# Patient Record
Sex: Female | Born: 1980 | Hispanic: Yes | Marital: Married | Smoking: Never smoker
Health system: Southern US, Community
[De-identification: ages and names within clinical notes are randomized; demographics above are authoritative.]

## PROBLEM LIST (undated history)

## (undated) ENCOUNTER — Inpatient Hospital Stay: Payer: Self-pay

## (undated) DIAGNOSIS — Z789 Other specified health status: Secondary | ICD-10-CM

## (undated) HISTORY — PX: NO PAST SURGERIES: SHX2092

---

## 2008-12-18 ENCOUNTER — Emergency Department: Payer: Self-pay | Admitting: Emergency Medicine

## 2009-03-03 ENCOUNTER — Ambulatory Visit: Payer: Self-pay | Admitting: Family Medicine

## 2009-03-03 IMAGING — US US OB US >=[ID] SNGL FETUS
1 series · 17 of 28 positions shown · non-contrast
Comparison: none

REASON FOR EXAM: dates
COMMENTS:

[Series 1: us ob us >=(id) sngl fetus · 17 of 53 slices shown]
[im 1/53]
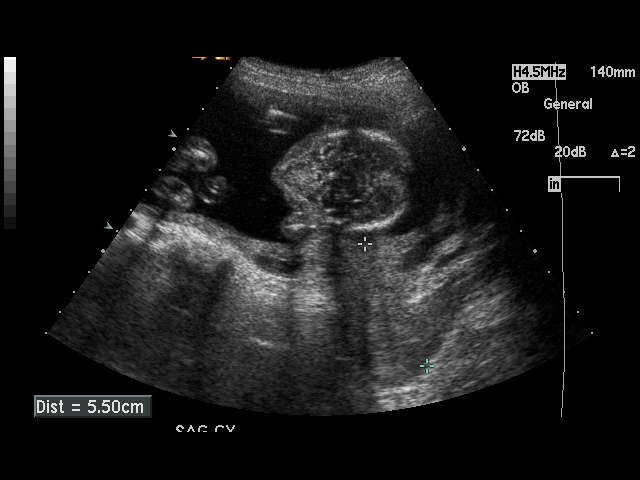
[im 4/53]
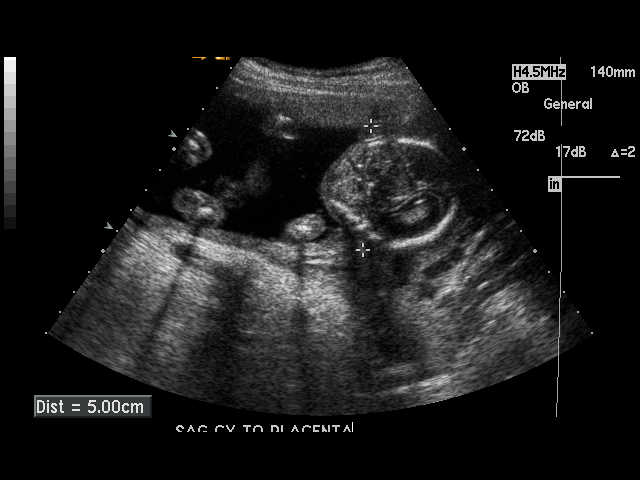
[im 8/53]
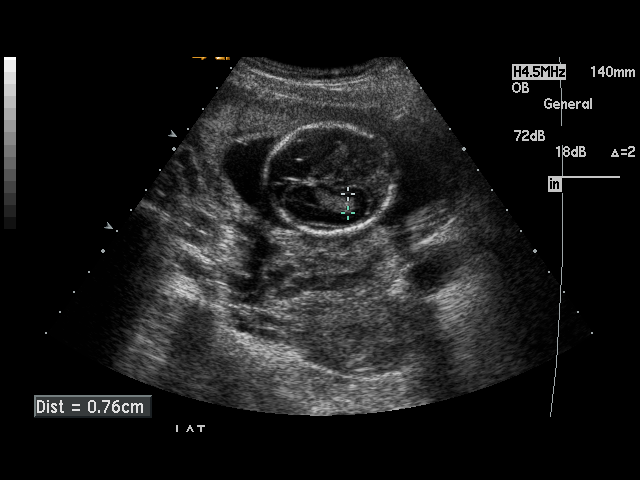
[im 10/53]
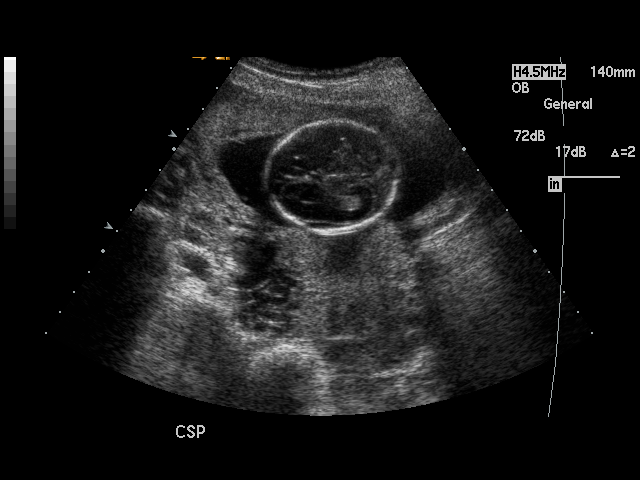
[im 14/53]
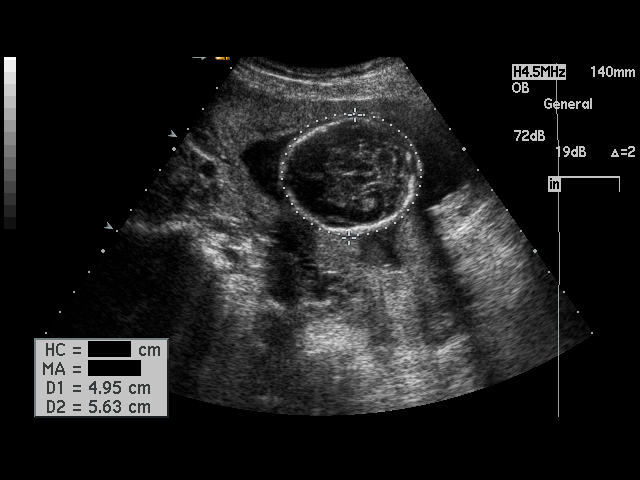
[im 18/53]
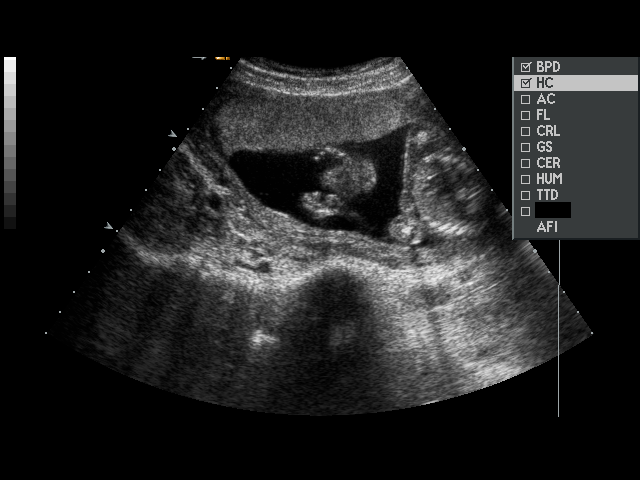
[im 20/53]
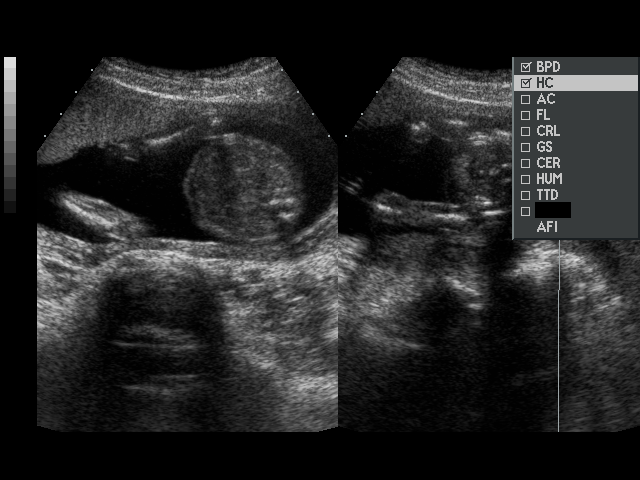
[im 24/53]
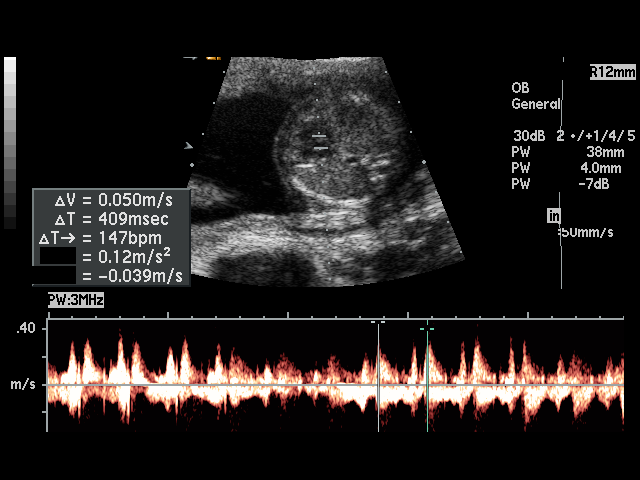
[im 27/53]
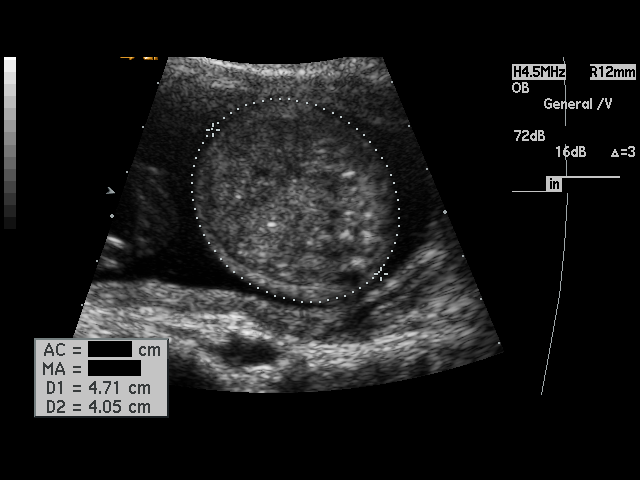
[im 29/53]
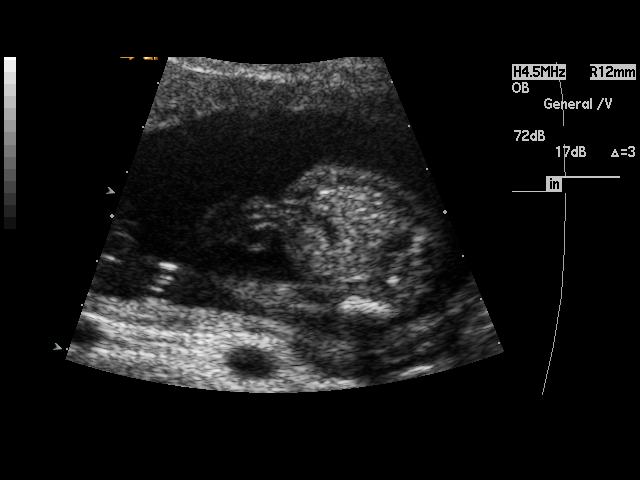
[im 33/53]
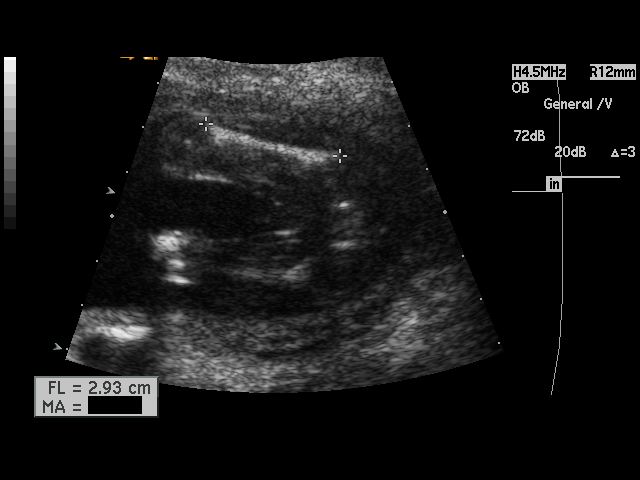
[im 35/53]
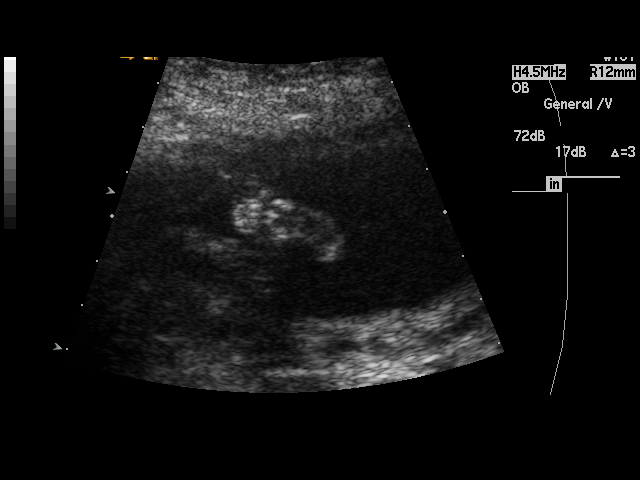
[im 39/53]
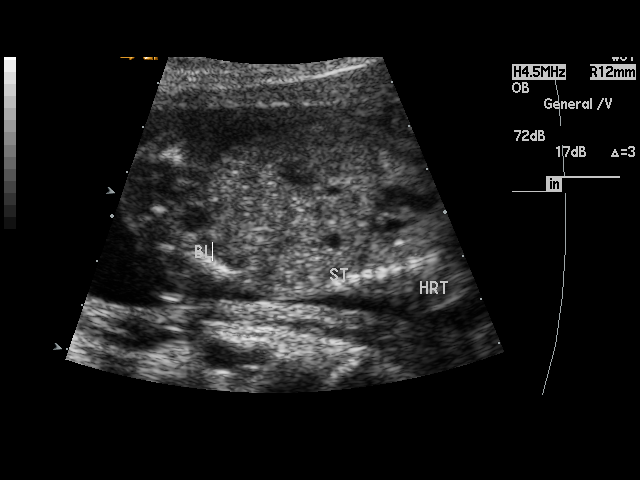
[im 43/53]
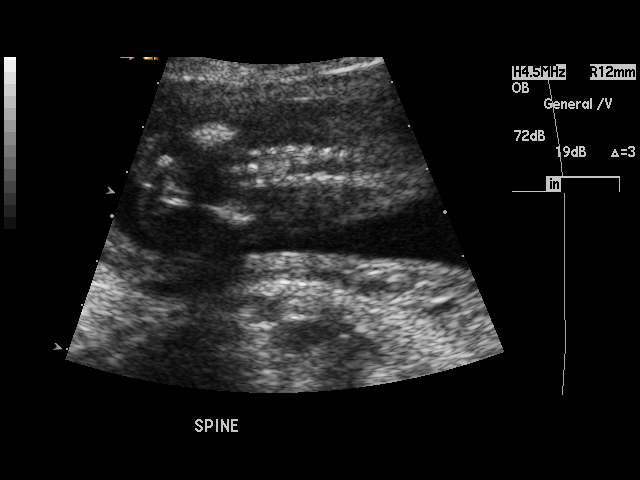
[im 45/53]
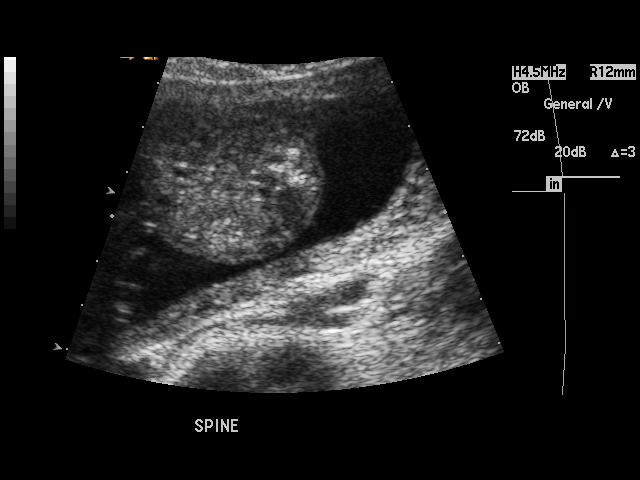
[im 49/53]
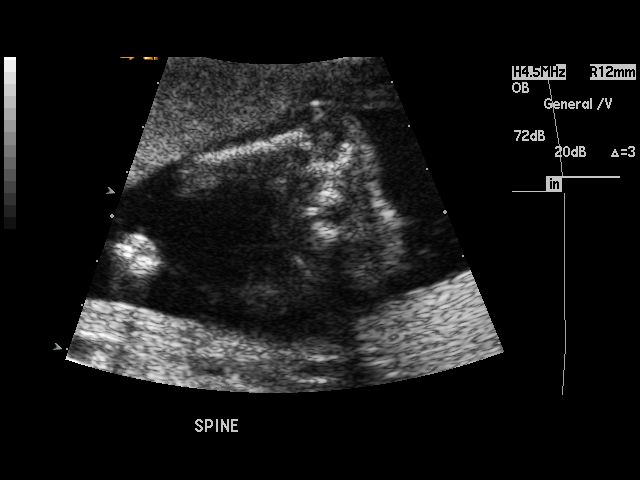
[im 53/53]
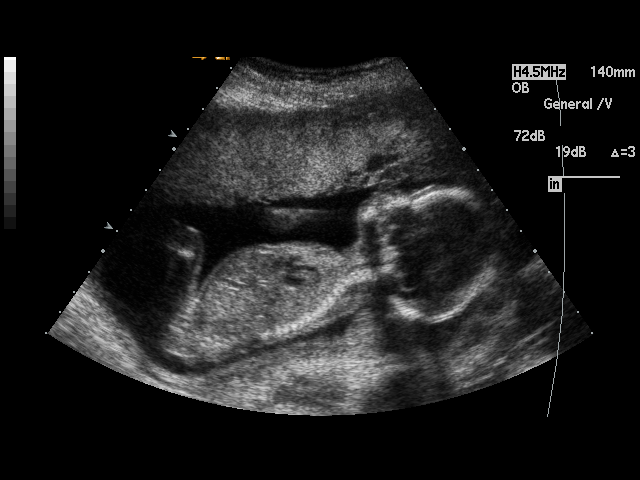

[17 of 28 positions shown; findings below may reference images not displayed]

PROCEDURE:     US  - US OB GREATER/OR EQUAL TO [J8]  - [DATE]  [DATE]

RESULT:     Sonographic evaluation of the pelvis utilizing an OB protocol
shows a single intrauterine fetus. The placenta is anterior to fundal and
shows a grade 0 appearance. Fetal cardiac, trunk and extremity motion is
present during the exam. There is a cephalic presentation. Fetal heart rate
is measured at 147 beats per minute. The fetal anatomy appears grossly
normal. The kidneys are not well seen. The amniotic fluid volume is visually
normal. The umbilical cord insertion is within normal limits. Gestational
age sonographically is 19 [J8] days with an estimated delivery date of
[DATE]. Current estimated fetal weight is 287 grams + / - 38 grams.
IMPRESSION: 19 [J8] day intrauterine fetus as described. No
significant abnormality demonstrated.

## 2009-08-01 ENCOUNTER — Inpatient Hospital Stay: Payer: Self-pay | Admitting: Obstetrics and Gynecology

## 2012-07-21 ENCOUNTER — Observation Stay: Payer: Self-pay | Admitting: Obstetrics and Gynecology

## 2012-07-24 ENCOUNTER — Inpatient Hospital Stay: Payer: Self-pay

## 2012-07-24 LAB — CBC WITH DIFFERENTIAL/PLATELET
Basophil %: 0.4 %
Eosinophil %: 3.8 %
HCT: 40.9 % (ref 35.0–47.0)
HGB: 13.7 g/dL (ref 12.0–16.0)
Lymphocyte #: 2.6 10*3/uL (ref 1.0–3.6)
MCV: 81 fL (ref 80–100)
Neutrophil #: 9.3 10*3/uL — ABNORMAL HIGH (ref 1.4–6.5)
Neutrophil %: 70.2 %
Platelet: 217 10*3/uL (ref 150–440)
RBC: 5.08 10*6/uL (ref 3.80–5.20)
WBC: 13.3 10*3/uL — ABNORMAL HIGH (ref 3.6–11.0)

## 2012-07-25 LAB — HEMATOCRIT: HCT: 28.7 % — ABNORMAL LOW (ref 35.0–47.0)

## 2014-08-06 NOTE — H&P (Signed)
L&D Evaluation:  History:  HPI 34 y/o G2P1001 @ 39/6wks [facility] 07/25/12 arrives this am with c/o leaking fluid, regular contractions and smallbloody show, baby is active.   Presents with contractions, leaking fluid   Patient's Medical History No Chronic Illness   Patient's Surgical History none   Medications Pre Natal Vitamins   Allergies NKDA   Social History none   Family History Non-Contributory   ROS:  ROS All systems were reviewed.  HEENT, CNS, GI, GU, Respiratory, CV, Renal and Musculoskeletal systems were found to be normal.   Exam:  Vital Signs stable   Urine Protein not completed   General no apparent distress   Mental Status clear   Chest clear   Heart normal sinus rhythm   Abdomen gravid, non-tender   Estimated Fetal Weight Average for gestational age   Fetal Position vtx   Fundal Height term   Back no CVAT   Edema no edema   Reflexes 1+   Clonus negative   Pelvic no external lesions, 5cm 60% vtx @ -2 Clear fluid nl show   Mebranes Ruptured, 0600   Description clear   FHT normal rate with no decels, baseline 130's 140's avg variability with accels   FHT Description 144   Ucx regular, EFM reapplied after ambulating   Skin dry   Lymph no lymphadenopathy   Impression:  Impression active labor   Plan:  Plan monitor contractions and for cervical change   Comments Admitted, knows what to expect. VSS AF FHR reactive. Breathing through uc's well, requests epidural. FOB supportive at bedside.   Electronic Signatures: Rosie Fate (CNM)  (Signed 28-Apr-14 10:31)  Authored: L&D Evaluation   Last Updated: 28-Apr-14 10:31 by Rosie Fate (CNM)

## 2014-12-18 LAB — HM PAP SMEAR: HM Pap smear: NEGATIVE

## 2017-03-01 ENCOUNTER — Other Ambulatory Visit: Payer: Self-pay | Admitting: Advanced Practice Midwife

## 2017-03-01 DIAGNOSIS — Z369 Encounter for antenatal screening, unspecified: Secondary | ICD-10-CM

## 2017-03-01 DIAGNOSIS — Z550 Illiteracy and low-level literacy: Secondary | ICD-10-CM | POA: Insufficient documentation

## 2017-03-02 LAB — OB RESULTS CONSOLE RPR: RPR: NONREACTIVE

## 2017-03-02 LAB — OB RESULTS CONSOLE HEPATITIS B SURFACE ANTIGEN: HEP B S AG: NEGATIVE

## 2017-03-17 ENCOUNTER — Ambulatory Visit (HOSPITAL_BASED_OUTPATIENT_CLINIC_OR_DEPARTMENT_OTHER)
Admission: RE | Admit: 2017-03-17 | Discharge: 2017-03-17 | Disposition: A | Discharge status: Home / Self Care | Payer: Medicaid Other | Source: Ambulatory Visit | Attending: Obstetrics and Gynecology | Admitting: Obstetrics and Gynecology

## 2017-03-17 ENCOUNTER — Ambulatory Visit
Admission: RE | Admit: 2017-03-17 | Discharge: 2017-03-17 | Disposition: A | Discharge status: Home / Self Care | Payer: Medicaid Other | Source: Ambulatory Visit | Attending: Advanced Practice Midwife | Admitting: Advanced Practice Midwife

## 2017-03-17 DIAGNOSIS — O09521 Supervision of elderly multigravida, first trimester: Secondary | ICD-10-CM | POA: Diagnosis present

## 2017-03-17 DIAGNOSIS — Z3A11 11 weeks gestation of pregnancy: Secondary | ICD-10-CM | POA: Insufficient documentation

## 2017-03-17 DIAGNOSIS — Z369 Encounter for antenatal screening, unspecified: Secondary | ICD-10-CM

## 2017-03-17 DIAGNOSIS — O09529 Supervision of elderly multigravida, unspecified trimester: Secondary | ICD-10-CM

## 2017-03-17 HISTORY — DX: Other specified health status: Z78.9

## 2017-03-17 NOTE — Progress Notes (Addendum)
Referring Provider:   [facility] Department Length of Consultation: 45 minutes  [name] was referred to Alyssa Marquez for genetic counseling because of advanced maternal age.  The patient will be 36 years old at the time of delivery.  This note summarizes the information we discussed.    We explained that the chance of a chromosome abnormality increases with maternal age.  Chromosomes and examples of chromosome problems were reviewed.  Humans typically have 46 chromosomes in each cell, with half passed through each sperm and egg.  Any change in the number or structure of chromosomes can increase the risk of problems in the physical and mental development of a pregnancy.   Based upon age of the patient, the chance of any chromosome abnormality was 1 in 35. The chance of Down syndrome, the most common chromosome problem associated with maternal age, was 1 in 13.  The risk of chromosome problems is in addition to the 3% general population risk for birth defects and mental retardation.  The greatest chance, of course, is that the baby would be born in good health.  We discussed the following prenatal screening and testing options for this pregnancy:  First trimester screening, which includes nuchal translucency ultrasound screen and first trimester maternal serum marker screening.  The nuchal translucency has approximately an 80% detection rate for Down syndrome and can be positive for other chromosome abnormalities as well as heart defects.  When combined with a maternal serum marker screening, the detection rate is up to 90% for Down syndrome and up to 97% for trisomy 18.     The chorionic villus sampling procedure is available for first trimester chromosome analysis.  This involves the withdrawal of a small amount of chorionic villi (tissue from the developing placenta).  Risk of pregnancy loss is estimated to be approximately 1 in 200 to 1 in 100 (0.5  to 1%).  There is approximately a 1% (1 in 100) chance that the CVS chromosome results will be unclear.  Chorionic villi cannot be tested for neural tube defects.     Maternal serum marker screening, a blood test that measures pregnancy proteins, can provide risk assessments for Down syndrome, trisomy 18, and open neural tube defects (spina bifida, anencephaly). Because it does not directly examine the fetus, it cannot positively diagnose or rule out these problems.  Targeted ultrasound uses high frequency sound waves to create an image of the developing fetus.  An ultrasound is often recommended as a routine means of evaluating the pregnancy.  It is also used to screen for fetal anatomy problems (for example, a heart defect) that might be suggestive of a chromosomal or other abnormality.   Amniocentesis involves the removal of a small amount of amniotic fluid from the sac surrounding the fetus with the use of a thin needle inserted through the maternal abdomen and uterus.  Ultrasound guidance is used throughout the procedure.  Fetal cells from amniotic fluid are directly evaluated and > 99.5% of chromosome problems and > 98% of open neural tube defects can be detected. This procedure is generally performed after the 15th week of pregnancy.  The main risks to this procedure include complications leading to miscarriage in less than 1 in 200 cases (0.5%).  We also reviewed the availability of cell free fetal DNA testing from maternal blood to determine whether or not the baby may have either Down syndrome, trisomy 13, or trisomy 12.  This test utilizes a maternal blood  sample and DNA sequencing technology to isolate circulating cell free fetal DNA from maternal plasma.  The fetal DNA can then be analyzed for DNA sequences that are derived from the three most common chromosomes involved in aneuploidy, chromosomes 13, 18, and 21.  If the overall amount of DNA is greater than the expected level for any of these  chromosomes, aneuploidy is suspected.  While we do not consider it a replacement for invasive testing and karyotype analysis, a negative result from this testing would be reassuring, though not a guarantee of a normal chromosome complement for the baby.  An abnormal result is certainly suggestive of an abnormal chromosome complement, though we would still recommend CVS or amniocentesis to confirm any findings from this testing.  Cystic Fibrosis and Spinal Muscular Atrophy (SMA) screening were also discussed with the patient. Both conditions are recessive, which means that both parents must be carriers in order to have a child with the disease.  Cystic fibrosis (CF) is one of the most common genetic conditions in persons of Caucasian ancestry.  This condition occurs in approximately 1 in 2,500 Caucasian persons and results in thickened secretions in the lungs, digestive, and reproductive systems.  For a baby to be at risk for having CF, both of the parents must be carriers for this condition.  Approximately 1 in 62 Caucasian persons is a carrier for CF.  Current carrier testing looks for the most common mutations in the gene for CF and can detect approximately 90% of carriers in the Caucasian population.  This means that the carrier screening can greatly reduce, but cannot eliminate, the chance for an individual to have a child with CF.  If an individual is found to be a carrier for CF, then carrier testing would be available for the partner. As part of [place] newborn screening profile, all babies born in the state of [place] will have a two-tier screening process.  Specimens are first tested to determine the concentration of immunoreactive trypsinogen (IRT).  The top 5% of specimens with the highest IRT values then undergo DNA testing using a panel of over 40 common CF mutations. SMA is a neurodegenerative disorder that leads to atrophy of skeletal muscle and overall weakness.  This condition is  also more prevalent in the Caucasian population, with 1 in 40-1 in 60 persons being a carrier and 1 in 6,000-1 in 10,000 children being affected.  There are multiple forms of the disease, with some causing death in infancy to other forms with survival into adulthood.  The genetics of SMA is complex, but carrier screening can detect up to 95% of carriers in the Caucasian population.  Similar to CF, a negative result can greatly reduce, but cannot eliminate, the chance to have a child with SMA.  We obtained a detailed family history and pregnancy history.  The family history is unremarkable for birth defects, developmental delays, recurrent pregnancy loss or known chromosome abnormalities.  [name] stated that this is her third pregnancy.  The couple has two healthy sons, ages 62 and 52 years.  She reported no complications or exposures that would be expected to increase the risk for birth defects.  After consideration of the options, [name] elected to proceed with an ultrasound and MaterniT21 testing. She declined CF and SMA carrier screening.  An ultrasound was performed at the time of the visit.  The gestational age was consistent with  41 weeks.  Fetal anatomy could not be assessed due  to early gestational age.  Please refer to the ultrasound report for details of that study. She is scheduled to return for a second trimester anatomy ultrasound (prior to the time her Pres[employer] Medicaid ends).  [name] was encouraged to call with questions or concerns.  We can be contacted at [phone].   Tests Ordered:  MaterniT21 PLUS with [facility]   Wilburt Finlay, MS, CGC  Donette Larry, MS, CGC performed an integral service incident to the physician's initial service.  I was physically present in the clinical area and was immediately available to render assistance.   Brittiny Levitz C Jacere Pangborn

## 2017-03-23 LAB — MISC LABCORP TEST (SEND OUT): Labcorp test code: 451934

## 2017-03-24 ENCOUNTER — Telehealth: Payer: Self-pay | Admitting: Obstetrics and Gynecology

## 2017-03-24 NOTE — Telephone Encounter (Signed)
The patient was informed of the results of her recent MaterniT21 testing which yielded NEGATIVE results.  The patient's specimen showed DNA consistent with two copies of chromosomes 21, 18 and 13.  The sensitivity for trisomy 66, trisomy 64 and trisomy 7 using this testing are reported as 99.1%, 99.9% and 91.7% respectively.  Thus, while the results of this testing are highly accurate, they are not considered diagnostic at this time.  Should more definitive information be desired, the patient may still consider amniocentesis.   As requested to know by the patient, sex chromosome analysis was included for this sample.  Results was consistent with a female fetus. This is predicted with >99% accuracy.  A maternal serum AFP only should be considered if screening for neural tube defects is desired.   Wilburt Finlay, MS, CGC

## 2017-03-29 NOTE — L&D Delivery Note (Signed)
Delivery Note  First Stage: Labor onset: 0800 Augmentation : none Analgesia /Anesthesia intrapartum: none SROM at 0800 09/24/17  Second Stage: Complete dilation at 1250 Onset of pushing at 1250 FHR second stage Cat II, variable decels to 105bpm  Delivery of a viable female on 09/24/2017 at 1304 by Hassan Buckler CNM delivery of fetal head in OA position with restitution to LOT. No nuchal cord;  Anterior then posterior shoulders delivered easily with gentle downward traction. Baby placed on mom's chest, and attended to by peds.  Cord double clamped after cessation of pulsation, cut by FOB Cord blood sample collected    Third Stage: Placenta delivered Spontaneously intact with [place] @ 1309 Placenta disposition: routine disposal Uterine tone Firm / bleeding moderate, several small clots removed from LUS then scant bleeding.   No laceration identified, abrasion to introitus hemostatic at 6 o'clock.  Anesthesia for repair: n/a Repair: n/a Est. Blood Loss (mL): 75  Complications: none  Mom to postpartum.  Baby to Couplet care / Skin to Skin.  Newborn: Birth Weight: pending  Apgar Scores: 8/9 Feeding planned: Both

## 2017-04-28 ENCOUNTER — Ambulatory Visit
Admission: RE | Admit: 2017-04-28 | Discharge: 2017-04-28 | Disposition: A | Discharge status: Home / Self Care | Payer: Self-pay | Source: Ambulatory Visit | Attending: Maternal & Fetal Medicine | Admitting: Maternal & Fetal Medicine

## 2017-04-28 DIAGNOSIS — O09522 Supervision of elderly multigravida, second trimester: Secondary | ICD-10-CM | POA: Insufficient documentation

## 2017-04-28 DIAGNOSIS — O3412 Maternal care for benign tumor of corpus uteri, second trimester: Secondary | ICD-10-CM | POA: Insufficient documentation

## 2017-04-28 DIAGNOSIS — Z3A17 17 weeks gestation of pregnancy: Secondary | ICD-10-CM | POA: Insufficient documentation

## 2017-04-28 DIAGNOSIS — O09529 Supervision of elderly multigravida, unspecified trimester: Secondary | ICD-10-CM

## 2017-04-28 DIAGNOSIS — D259 Leiomyoma of uterus, unspecified: Secondary | ICD-10-CM | POA: Insufficient documentation

## 2017-07-11 ENCOUNTER — Other Ambulatory Visit: Payer: Self-pay | Admitting: *Deleted

## 2017-07-11 DIAGNOSIS — O09521 Supervision of elderly multigravida, first trimester: Secondary | ICD-10-CM

## 2017-07-14 ENCOUNTER — Ambulatory Visit
Admission: RE | Admit: 2017-07-14 | Discharge: 2017-07-14 | Disposition: A | Discharge status: Home / Self Care | Payer: Self-pay | Source: Ambulatory Visit | Attending: Maternal & Fetal Medicine | Admitting: Maternal & Fetal Medicine

## 2017-07-14 DIAGNOSIS — Z3A28 28 weeks gestation of pregnancy: Secondary | ICD-10-CM | POA: Insufficient documentation

## 2017-07-14 DIAGNOSIS — O09521 Supervision of elderly multigravida, first trimester: Secondary | ICD-10-CM | POA: Insufficient documentation

## 2017-07-18 LAB — HM HIV SCREENING LAB: HM HIV Screening: NEGATIVE

## 2017-07-18 LAB — OB RESULTS CONSOLE HIV ANTIBODY (ROUTINE TESTING): HIV: NONREACTIVE

## 2017-09-06 LAB — OB RESULTS CONSOLE GC/CHLAMYDIA
Chlamydia: NEGATIVE
Gonorrhea: NEGATIVE

## 2017-09-07 LAB — OB RESULTS CONSOLE GBS: GBS: NEGATIVE

## 2017-09-23 ENCOUNTER — Observation Stay
Admission: EM | Admit: 2017-09-23 | Discharge: 2017-09-23 | Disposition: A | Discharge status: Home / Self Care | Payer: Self-pay | Attending: Obstetrics and Gynecology | Admitting: Obstetrics and Gynecology

## 2017-09-23 DIAGNOSIS — Z79899 Other long term (current) drug therapy: Secondary | ICD-10-CM | POA: Insufficient documentation

## 2017-09-23 DIAGNOSIS — Z550 Illiteracy and low-level literacy: Secondary | ICD-10-CM | POA: Insufficient documentation

## 2017-09-23 DIAGNOSIS — Z3490 Encounter for supervision of normal pregnancy, unspecified, unspecified trimester: Secondary | ICD-10-CM

## 2017-09-23 DIAGNOSIS — Z3A38 38 weeks gestation of pregnancy: Secondary | ICD-10-CM | POA: Insufficient documentation

## 2017-09-23 DIAGNOSIS — Z0379 Encounter for other suspected maternal and fetal conditions ruled out: Principal | ICD-10-CM | POA: Insufficient documentation

## 2017-09-23 DIAGNOSIS — O09523 Supervision of elderly multigravida, third trimester: Secondary | ICD-10-CM | POA: Insufficient documentation

## 2017-09-23 LAB — ROM PLUS (ARMC ONLY): ROM PLUS: NEGATIVE

## 2017-09-23 LAB — OB RESULTS CONSOLE RUBELLA ANTIBODY, IGM: RUBELLA: IMMUNE

## 2017-09-23 LAB — OB RESULTS CONSOLE VARICELLA ZOSTER ANTIBODY, IGG: Varicella: IMMUNE

## 2017-09-23 NOTE — Progress Notes (Signed)
Spoke with Hassan Buckler, CNM in department about pt cervix unchanged, ROM plus negative and nitrazine negative. Pt OK to discharge with strict labor precautions per CNM. FHR reading tachystole but pt position leaning forward and monitor not tracing accurately. Monitors adjusted by Pauletta Browns, RN, will discharge pt after assess HR.

## 2017-09-23 NOTE — Progress Notes (Signed)
[facility]iann Pulse [facility] called to report that pt called them Tuesday to report a "gush of fluid" and was advised to come to [facility] at that time.

## 2017-09-23 NOTE — Progress Notes (Signed)
Ronnald Collum [facility] interpretor present for interpretation. G3P2 pt 38w5 presents to b/p due to possible ROM two days ago. Pt states that she  thought her water broke two days ago but then it stopped leaking. She was aware of some more small leaks today and ctx every 15 minutes. Denies vaginal bleeding.

## 2017-09-23 NOTE — Progress Notes (Signed)
ROm plus collected with Rosemarie Ax, [facility] interpretor present.

## 2017-09-23 NOTE — Discharge Summary (Signed)
Razan Siler is a 37 y.o. female. She is at [redacted]w[redacted]d gestation. Patient's last menstrual period was 12/26/2016. Estimated Date of Delivery: 10/02/17  Prenatal care site: [facility]   Current pregnancy complicated by:  1. Advanced maternal Age 37. Resolved low-lying placenta at 28wks 3. 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   S: Resting comfortably. Active fetal movement noted. Denies: HA, visual changes, SOB, or RUQ/epigastric pain.   Maternal Medical History:   Past Medical History:  Diagnosis Date  . Medical history non-contributory     History reviewed. No pertinent surgical history.  No Known Allergies  Prior to Admission medications   Medication Sig Start Date End Date Taking? Authorizing Provider  Prenatal Vit-Fe Fumarate-FA (PRENATAL MULTIVITAMIN) TABS tablet Take 1 tablet by mouth daily at 12 noon.   Yes [provider]      Social History: She  reports that she has never smoked. She has never used smokeless tobacco. She reports that she does not drink alcohol or use drugs.  Family History:  no history of gyn cancers  Review of Systems: A full review of systems was performed and negative except as noted in the HPI.     O:  BP 106/67 (BP Location: Left Arm)   Pulse 95   Temp 98.3 F (36.8 C) (Oral)   Resp 16   LMP 12/26/2016  Results for orders placed or performed during the hospital encounter of 09/23/17 (from the past 48 hour(s))  ROM Plus ([facility] only)   Collection Time: 09/23/17  4:32 PM  Result Value Ref Range   Rom Plus NEGATIVE      Constitutional: NAD, AAOx3  HE/ENT: extraocular movements grossly intact, moist mucous membranes CV: RRR PULM: nl respiratory effort, CTABL     Abd: gravid, non-tender, non-distended, soft      Ext: Non-tender, Nonedematous   Psych: mood appropriate, speech  normal SVE: initial cx exam 4-5cm per nursing; recheck several hours later with no change.  Negative nitrazine.   Fetal  monitoring: NST; Cat I Appropriate for GA Baseline: 135bpm Variability: moderate Accelerations:  present x >2 Decelerations absent     A/P: 37 y.o. [redacted]w[redacted]d here for antenatal surveillance for leaking fluid  Labor: not present. No indication of ROM.   Fetal Wellbeing: Reassuring Cat 1 tracing. Reactive NST   Reviewed labor precautions at length.   D/c home stable, precautions reviewed, follow-up as scheduled.    McVey, [place], CNM 09/23/2017  7:48 PM

## 2017-09-23 NOTE — OB Triage Note (Signed)
Patient given discharge instructions utilizing video interpreter services interpreter Cory Roughen [phone]. Patient given instructions to keep follow up appointment on Monday unless anything changes before then.  Patient to return to hospital for leaking of fluid, vaginal bleeding, increase in painful contractions, or decreased fetal movement. Fetal kick count instructions included in AVS. Patient verbalized understanding and agreed to plan. Discharged home ambulatory in stable condition accompanied by significant other.

## 2017-09-24 ENCOUNTER — Inpatient Hospital Stay
Admission: EM | Admit: 2017-09-24 | Discharge: 2017-09-25 | DRG: 807 | Disposition: A | Discharge status: Home / Self Care | Payer: Medicaid Other | Attending: Obstetrics and Gynecology | Admitting: Obstetrics and Gynecology

## 2017-09-24 ENCOUNTER — Encounter: Payer: Self-pay | Admitting: *Deleted

## 2017-09-24 ENCOUNTER — Inpatient Hospital Stay: Payer: Medicaid Other | Admitting: Anesthesiology

## 2017-09-24 ENCOUNTER — Other Ambulatory Visit: Payer: Self-pay

## 2017-09-24 DIAGNOSIS — Z3A38 38 weeks gestation of pregnancy: Secondary | ICD-10-CM

## 2017-09-24 DIAGNOSIS — Z3483 Encounter for supervision of other normal pregnancy, third trimester: Secondary | ICD-10-CM | POA: Diagnosis present

## 2017-09-24 LAB — TYPE AND SCREEN
ABO/RH(D): A POS
Antibody Screen: NEGATIVE

## 2017-09-24 LAB — CBC
HEMATOCRIT: 40.1 % (ref 35.0–47.0)
HEMOGLOBIN: 13.4 g/dL (ref 12.0–16.0)
MCH: 25.9 pg — AB (ref 26.0–34.0)
MCHC: 33.4 g/dL (ref 32.0–36.0)
MCV: 77.7 fL — AB (ref 80.0–100.0)
Platelets: 213 10*3/uL (ref 150–440)
RBC: 5.16 MIL/uL (ref 3.80–5.20)
RDW: 15 % — ABNORMAL HIGH (ref 11.5–14.5)
WBC: 12.8 10*3/uL — ABNORMAL HIGH (ref 3.6–11.0)

## 2017-09-24 MED ORDER — SENNOSIDES-DOCUSATE SODIUM 8.6-50 MG PO TABS
2.0000 | ORAL_TABLET | ORAL | Status: DC
  Administered 2017-09-25: 2 via ORAL
  Filled 2017-09-24 (×2): qty 2

## 2017-09-24 MED ORDER — ONDANSETRON HCL 4 MG/2ML IJ SOLN: 4.0000 mg | INTRAMUSCULAR | Status: DC | PRN

## 2017-09-24 MED ORDER — ONDANSETRON HCL 4 MG/2ML IJ SOLN: 4.0000 mg | Freq: Four times a day (QID) | INTRAMUSCULAR | Status: DC | PRN

## 2017-09-24 MED ORDER — ACETAMINOPHEN 325 MG PO TABS
650.0000 mg | ORAL_TABLET | ORAL | Status: DC | PRN
  Filled 2017-09-24: qty 2

## 2017-09-24 MED ORDER — LIDOCAINE HCL (PF) 1 % IJ SOLN: 30.0000 mL | INTRAMUSCULAR | Status: DC | PRN

## 2017-09-24 MED ORDER — FENTANYL 2.5 MCG/ML W/ROPIVACAINE 0.15% IN NS 100 ML EPIDURAL (ARMC)
EPIDURAL | Status: AC
  Filled 2017-09-24: qty 100

## 2017-09-24 MED ORDER — FENTANYL 2.5 MCG/ML W/ROPIVACAINE 0.15% IN NS 100 ML EPIDURAL (ARMC): 12.0000 mL/h | EPIDURAL | Status: DC

## 2017-09-24 MED ORDER — OXYTOCIN 40 UNITS IN LACTATED RINGERS INFUSION - SIMPLE MED
INTRAVENOUS | Status: AC
  Filled 2017-09-24: qty 1000

## 2017-09-24 MED ORDER — PHENYLEPHRINE 40 MCG/ML (10ML) SYRINGE FOR IV PUSH (FOR BLOOD PRESSURE SUPPORT)
80.0000 ug | PREFILLED_SYRINGE | INTRAVENOUS | Status: DC | PRN
  Filled 2017-09-24: qty 5

## 2017-09-24 MED ORDER — SIMETHICONE 80 MG PO CHEW: 80.0000 mg | CHEWABLE_TABLET | ORAL | Status: DC | PRN

## 2017-09-24 MED ORDER — DIPHENHYDRAMINE HCL 50 MG/ML IJ SOLN: 12.5000 mg | INTRAMUSCULAR | Status: DC | PRN

## 2017-09-24 MED ORDER — OXYTOCIN 10 UNIT/ML IJ SOLN
INTRAMUSCULAR | Status: AC
  Filled 2017-09-24: qty 2

## 2017-09-24 MED ORDER — DIBUCAINE 1 % RE OINT: 1.0000 "application " | TOPICAL_OINTMENT | RECTAL | Status: DC | PRN

## 2017-09-24 MED ORDER — ACETAMINOPHEN 325 MG PO TABS
650.0000 mg | ORAL_TABLET | ORAL | Status: DC | PRN
  Administered 2017-09-24 (×2): 650 mg via ORAL
  Filled 2017-09-24: qty 2

## 2017-09-24 MED ORDER — LACTATED RINGERS IV SOLN
INTRAVENOUS | Status: DC
  Administered 2017-09-24: 12:00:00 via INTRAVENOUS

## 2017-09-24 MED ORDER — WITCH HAZEL-GLYCERIN EX PADS
1.0000 | MEDICATED_PAD | CUTANEOUS | Status: DC | PRN
Start: 2017-09-24 — End: 2017-09-25

## 2017-09-24 MED ORDER — AMMONIA AROMATIC IN INHA
RESPIRATORY_TRACT | Status: AC
  Filled 2017-09-24: qty 10

## 2017-09-24 MED ORDER — LIDOCAINE HCL (PF) 1 % IJ SOLN
INTRAMUSCULAR | Status: AC
  Filled 2017-09-24: qty 30

## 2017-09-24 MED ORDER — IBUPROFEN 600 MG PO TABS
ORAL_TABLET | ORAL | Status: AC
  Administered 2017-09-24: 600 mg
  Filled 2017-09-24: qty 1

## 2017-09-24 MED ORDER — SOD CITRATE-CITRIC ACID 500-334 MG/5ML PO SOLN: 30.0000 mL | ORAL | Status: DC | PRN

## 2017-09-24 MED ORDER — OXYTOCIN 40 UNITS IN LACTATED RINGERS INFUSION - SIMPLE MED: 2.5000 [IU]/h | INTRAVENOUS | Status: DC

## 2017-09-24 MED ORDER — OXYTOCIN BOLUS FROM INFUSION
500.0000 mL | Freq: Once | INTRAVENOUS | Status: AC
  Administered 2017-09-24: 500 mL via INTRAVENOUS

## 2017-09-24 MED ORDER — IBUPROFEN 600 MG PO TABS
600.0000 mg | ORAL_TABLET | Freq: Four times a day (QID) | ORAL | Status: DC
  Administered 2017-09-24 – 2017-09-25 (×4): 600 mg via ORAL
  Filled 2017-09-24 (×4): qty 1

## 2017-09-24 MED ORDER — COCONUT OIL OIL: 1.0000 "application " | TOPICAL_OIL | Status: DC | PRN

## 2017-09-24 MED ORDER — EPHEDRINE 5 MG/ML INJ
10.0000 mg | INTRAVENOUS | Status: DC | PRN
  Filled 2017-09-24: qty 2

## 2017-09-24 MED ORDER — BENZOCAINE-MENTHOL 20-0.5 % EX AERO: 1.0000 "application " | INHALATION_SPRAY | CUTANEOUS | Status: DC | PRN

## 2017-09-24 MED ORDER — ONDANSETRON HCL 4 MG PO TABS: 4.0000 mg | ORAL_TABLET | ORAL | Status: DC | PRN

## 2017-09-24 MED ORDER — LACTATED RINGERS IV SOLN: 500.0000 mL | Freq: Once | INTRAVENOUS | Status: DC

## 2017-09-24 MED ORDER — DIPHENHYDRAMINE HCL 25 MG PO CAPS: 25.0000 mg | ORAL_CAPSULE | Freq: Four times a day (QID) | ORAL | Status: DC | PRN

## 2017-09-24 MED ORDER — MISOPROSTOL 200 MCG PO TABS
ORAL_TABLET | ORAL | Status: AC
  Filled 2017-09-24: qty 4

## 2017-09-24 MED ORDER — LACTATED RINGERS IV SOLN
500.0000 mL | INTRAVENOUS | Status: DC | PRN
  Administered 2017-09-24: 500 mL via INTRAVENOUS

## 2017-09-24 MED ORDER — PRENATAL MULTIVITAMIN CH
1.0000 | ORAL_TABLET | Freq: Every day | ORAL | Status: DC
  Administered 2017-09-25: 1 via ORAL
  Filled 2017-09-24: qty 1

## 2017-09-24 MED ORDER — ZOLPIDEM TARTRATE 5 MG PO TABS: 5.0000 mg | ORAL_TABLET | Freq: Every evening | ORAL | Status: DC | PRN

## 2017-09-24 NOTE — Discharge Summary (Signed)
Obstetrical Discharge Summary  Patient Name: Alyssa Marquez DOB: 02/13/1981 MRN: [id number]  Date of Admission: 09/24/2017 Date of Delivery: 09/24/17 Delivered by: Hassan Buckler CNM Date of Discharge: 09/25/2017  Primary OB: [facility]  BPZ:WCHENID'P last menstrual period was 12/26/2016. EDC Estimated Date of Delivery: 10/02/17 Gestational Age at Delivery: [redacted]w[redacted]d   Antepartum complications:  1. Advanced maternal Age 37. Resolved low-lying placenta at 28wks 3. 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 Labor and delivery, indication for care 09/24/2017  . Pregnancy 09/23/2017  . Advanced maternal age in multigravida, first trimester     Augmentation: none Complications: None Intrapartum complications/course: Admitted with SROM at 5cm, no augmentation and spontaneous progression to C/C/+1; pushed effectively to crowning. SVD of viable female, no [place], abrasion hemostatic at introitus, no repair.  Date of Delivery: 09/24/17 Delivered By: Hassan Buckler CNM Delivery Type: spontaneous vaginal delivery Anesthesia: none Placenta: spontaneous Laceration: introitus abrasion, no repair.  Episiotomy: none Newborn Data: Live born female Birth Weight: 8#7oz APGAR: 8, 9  Newborn Delivery   Birth date/time:  09/24/2017 13:04:00 Delivery type:  Vaginal, Spontaneous       Postpartum Procedures: none  Post partum course:  Patient had an uncomplicated postpartum course.  By time of discharge on PPD#1, her pain was controlled on oral pain medications; she had appropriate lochia and was ambulating, voiding without difficulty and tolerating regular diet.  She was deemed stable for discharge to home.      Discharge Physical Exam:  BP 99/70 (BP Location: Left Arm)   Pulse 89   Temp 98.2 F (36.8 C) (Oral)   Resp 18   LMP 12/26/2016   SpO2 99%   Breastfeeding? Unknown   General: NAD CV: RRR Pulm: CTABL, nl  effort ABD: s/nd/nt, fundus firm and below the umbilicus Lochia: small Perineum: well approximated/intact DVT Evaluation: LE non-ttp, no evidence of DVT on exam.  Hemoglobin  Date Value Ref Range Status  09/25/2017 12.0 12.0 - 16.0 g/dL Final   HGB  Date Value Ref Range Status  07/24/2012 13.7 12.0 - 16.0 g/dL Final   HCT  Date Value Ref Range Status  09/25/2017 35.8 35.0 - 47.0 % Final  07/25/2012 28.7 (L) 35.0 - 47.0 % Final     Disposition: stable, discharge to home. Baby Feeding: breastmilk and formula Baby Disposition: home with mom  Rh Immune globulin given: n/a Rubella vaccine given: n/a Varicella vaccine given: n/a Tdap vaccine given in AP setting: 07/18/17 Flu vaccine given in AP setting: 03/01/17  Contraception: Paragard IUD  Prenatal Labs:  Blood type/Rh  A Pos  Antibody screen neg  Rubella Immune  Varicella Immune  RPR NR  HBsAg Neg  HIV NR  GC neg  Chlamydia neg  Genetic screening negative  1 hour GTT  107  GBS  neg     Plan:  Alyssa Marquez was discharged to home in good condition. Follow-up appointment with delivering provider in 6 weeks.  Discharge Medications: Allergies as of 09/25/2017   No Known Allergies     Medication List    TAKE these medications   acetaminophen 325 MG tablet Commonly known as:  TYLENOL Take 650 mg by mouth every 6 (six) hours as needed.   ibuprofen 600 MG tablet Commonly known as:  ADVIL,MOTRIN Take 1 tablet (600 mg total) by mouth every 6 (six) hours.   prenatal multivitamin Tabs tablet Take 1 tablet by  mouth daily at 12 noon.   senna-docusate 8.6-50 MG tablet Commonly known as:  Senokot-S Take 2 tablets by mouth daily. Start taking on:  09/26/2017       Follow-up Information    McVey, Murray Hodgkins, CNM Follow up in 4 week(s).   Specialty:  Obstetrics and Gynecology Contact information: [place] [address] [phone]           Signed:  Francetta Found,  CNM 09/25/2017  11:58 AM

## 2017-09-24 NOTE — Progress Notes (Signed)
Interpretor request placed

## 2017-09-24 NOTE — H&P (Signed)
OB History & Physical   History of Present Illness:  Chief Complaint:   HPI:  Alyssa Marquez is a 37 y.o. G3P0 female at [redacted]w[redacted]d dated by LMP and c/w [place] at 11+4wks. Estimated Date of Delivery: 10/02/17.  She presents to L&D for c/o large gush of clear fluid with bloody show this am at 0800 followed by worsening painful UCs 7-8/10 pain in back and lower abdomen. Reports + FM.   Pregnancy Issues: 1. Advanced maternal Age 76. Resolved low-lying placenta at 28wks 3. 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 Medical history non-contributory   Pt denies medical hx.   No past surgical history on file. denies surgeries.   No Known Allergies  Prior to Admission medications   Medication Sig Start Date End Date Taking? Authorizing Provider  Prenatal Vit-Fe Fumarate-FA (PRENATAL MULTIVITAMIN) TABS tablet Take 1 tablet by mouth daily at 12 noon.    [provider]     Prenatal care site: [facility]   Social History: She  reports that she has never smoked. She has never used smokeless tobacco. She reports that she does not drink alcohol or use drugs.  Family History:no family history of gyn cancers.   Review of Systems: A full review of systems was performed and negative except as noted in the HPI.     Physical Exam:  Vital Signs: BP 109/70 (BP Location: Right Arm)   Pulse 93   Temp 97.9 F (36.6 C) (Oral)   Resp 16   LMP 12/26/2016  General: no acute distress.  HEENT: normocephalic, atraumatic Heart: regular rate & rhythm.  No murmurs/rubs/gallops Lungs: clear to auscultation bilaterally, normal respiratory effort Abdomen: soft, gravid, non-tender;  EFW: 7lbs Pelvic:   External: Normal external female genitalia  Cervix: Dilation: 5 / Effacement (%): 80 / Station: -1 leaking clear pink fluid.    Extremities: non-tender, symmetric, no edema bilaterally.  DTRs: 2+  Neurologic: Alert &  oriented x 3.    Results for orders placed or performed during the hospital encounter of 09/23/17 (from the past 24 hour(s))  ROM Plus ([facility] only)     Status: None   Collection Time: 09/23/17  4:32 PM  Result Value Ref Range   Rom Plus NEGATIVE     Pertinent Results:  Prenatal Labs: Blood type/Rh  A Pos  Antibody screen neg  Rubella Immune  Varicella Immune  RPR NR  HBsAg Neg  HIV NR  GC neg  Chlamydia neg  Genetic screening negative  1 hour GTT  107  GBS  neg   FHT:140bpm, mod variability, no accels, no decels TOCO: q2-66min, palpate mod.  SVE:  Dilation: 5 / Effacement (%): 80 / Station: -1 soft/posterior   Cephalic by leopolds and exam  No results found.  Assessment:  Alyssa Marquez is a 37 y.o. G3P0 female at [redacted]w[redacted]d with labor and SROM.   Plan:  1. Admit to Labor & Delivery; consents reviewed and obtained  2. Fetal Well being  - Fetal Tracing: Cat I tracing - Group B Streptococcus ppx indicated: negative - Presentation: cephalic confirmed by exam and leopolds   3. Routine OB: - Prenatal labs reviewed, as above - Rh A Pos - CBC, T&S, RPR on admit - Clear fluids, IVF  4. Monitoring of Labor -  Contractions : external toco in place -  Pelvis proven to 8#6 -  Plan for augmentation prn -  Plan for continuous fetal monitoring  -  Maternal pain control as desired; requesting  regional anesthesia - Anticipate vaginal delivery  5. Post Partum Planning: - Infant feeding: both - Contraception: paragard IUD  [place], [place], CNM 09/24/17 11:07 AM

## 2017-09-24 NOTE — Lactation Note (Signed)
This note was copied from a baby's chart. Lactation Consultation Note  Patient Name: Alyssa Marquez EFEOF'H Date: 09/24/2017 Reason for consult: Initial assessment;Early term 37-38.6wks;Difficult latch [name] was sleepy.  Mom could not get [name] to latch.  Mom talking about giving bottle of formula if he will not latch.  Through [place] interpreter, discovered that she breast fed first baby for 2 years.  He would not take the formula.  He would only take the breast.  With the second baby, he would not latch so she started giving him formula and her milk dried up.  Discussed risks of giving bottles of formula too early to successful breast feeding.  Explained supply and demand and need to breast feed frequently especially when in the colostrum phase to bring in mature milk and ensure a plentiful milk supply.  Demonstrated waking techniques and put him skin to skin with mom in cross cradle hold.  [name] kept falling asleep at the breast.  Repositioned in football hold with pillow support.  [name] latched and began strong rhythmic sucking for 8 minutes before coming off and falling back to sleep.  Reviewed normal course of lactation and routine newborn feeding patterns.  Lactation name and number written on white board and encouraged to call with any questions, concerns or assistance.    Maternal Data Formula Feeding for Exclusion: No Has patient been taught Hand Expression?: Yes Does the patient have breastfeeding experience prior to this delivery?: Yes  Feeding Feeding Type: Breast Fed Length of feed: 8 min  LATCH Score Latch: Repeated attempts needed to sustain latch, nipple held in mouth throughout feeding, stimulation needed to elicit sucking reflex.  Audible Swallowing: A few with stimulation  Type of Nipple: Everted at rest and after stimulation  Comfort (Breast/Nipple): Soft / non-tender  Hold (Positioning): Assistance needed to correctly position infant at breast and  maintain latch.  LATCH Score: 7  Interventions Interventions: Breast feeding basics reviewed;Position options;Assisted with latch;Reverse pressure;Skin to skin;Breast compression;Breast massage;Adjust position;Hand express;Support pillows  Lactation Tools Discussed/Used     Consult Status Consult Status: Follow-up Follow-up type: Call as needed    Jarold Motto 09/24/2017, 7:29 PM

## 2017-09-24 NOTE — Progress Notes (Signed)
Request Advanced Directive education. AD education offered with use of interpreter. Prayer offered for new paper "Junior."

## 2017-09-25 LAB — CBC
HCT: 35.8 % (ref 35.0–47.0)
HEMOGLOBIN: 12 g/dL (ref 12.0–16.0)
MCH: 25.9 pg — ABNORMAL LOW (ref 26.0–34.0)
MCHC: 33.4 g/dL (ref 32.0–36.0)
MCV: 77.5 fL — ABNORMAL LOW (ref 80.0–100.0)
Platelets: 201 10*3/uL (ref 150–440)
RBC: 4.62 MIL/uL (ref 3.80–5.20)
RDW: 15 % — ABNORMAL HIGH (ref 11.5–14.5)
WBC: 9.7 10*3/uL (ref 3.6–11.0)

## 2017-09-25 MED ORDER — SENNOSIDES-DOCUSATE SODIUM 8.6-50 MG PO TABS: 2.0000 | ORAL_TABLET | ORAL | Status: AC

## 2017-09-25 MED ORDER — IBUPROFEN 600 MG PO TABS: 600.0000 mg | ORAL_TABLET | Freq: Four times a day (QID) | ORAL | 0 refills | Status: AC

## 2017-09-25 NOTE — Progress Notes (Signed)
Post Partum Day 1 Subjective: Doing well, no complaints.  Tolerating regular diet, pain with PO meds, voiding and ambulating without difficulty.  No CP SOB Fever,Chills, N/V or leg pain; denies nipple or breast pain; no HA change of vision, RUQ/epigastric pain  Objective: BP 99/70 (BP Location: Left Arm)   Pulse 89   Temp 98.2 F (36.8 C) (Oral)   Resp 18   LMP 12/26/2016   SpO2 99%   Breastfeeding? Unknown    Physical Exam:  General: NAD Breasts: soft/nontender CV: RRR Pulm: nl effort, CTABL Abdomen: soft, NT, BS x 4 Perineum: minimal edema, intact Lochia: small Uterine Fundus: fundus firm and 2 fb below umbilicus DVT Evaluation: no cords, ttp LEs   Recent Labs    09/24/17 1132 09/25/17 0816  HGB 13.4 12.0  HCT 40.1 35.8  WBC 12.8* 9.7  PLT 213 201    Assessment/Plan: 37 y.o. G3P3003 postpartum day # 1  - Continue routine PP care - Lactation consult prn  - Discussed contraceptive options including implant, IUDs hormonal and non-hormonal, injection, pills/ring/patch, condoms, and NFP. Pt desires paragard IUD- to have 4wk checkup at [facility]. - Immunization status: all Imms up to date    Disposition: Does desire Dc home today.     Horice Carrero, [place], CNM 09/25/2017  11:05 AM

## 2017-09-25 NOTE — Progress Notes (Signed)
Discharge instructions given. Patient verbalizes understanding of teaching. Patient discharged home via wheelchair at 1610.

## 2017-09-26 LAB — RPR: RPR: NONREACTIVE

## 2019-05-22 DIAGNOSIS — Z550 Illiteracy and low-level literacy: Secondary | ICD-10-CM

## 2019-05-22 DIAGNOSIS — D259 Leiomyoma of uterus, unspecified: Secondary | ICD-10-CM

## 2019-05-23 ENCOUNTER — Other Ambulatory Visit: Payer: Self-pay

## 2019-05-23 ENCOUNTER — Encounter: Payer: Self-pay | Admitting: Advanced Practice Midwife

## 2019-05-23 ENCOUNTER — Ambulatory Visit (LOCAL_COMMUNITY_HEALTH_CENTER): Payer: Self-pay | Admitting: Advanced Practice Midwife

## 2019-05-23 VITALS — BP 107/70 | Ht 61.0 in | Wt 136.0 lb

## 2019-05-23 DIAGNOSIS — Z30431 Encounter for routine checking of intrauterine contraceptive device: Secondary | ICD-10-CM

## 2019-05-23 DIAGNOSIS — B9689 Other specified bacterial agents as the cause of diseases classified elsewhere: Secondary | ICD-10-CM

## 2019-05-23 DIAGNOSIS — Z3009 Encounter for other general counseling and advice on contraception: Secondary | ICD-10-CM

## 2019-05-23 DIAGNOSIS — E663 Overweight: Secondary | ICD-10-CM

## 2019-05-23 DIAGNOSIS — N760 Acute vaginitis: Secondary | ICD-10-CM

## 2019-05-23 LAB — WET PREP FOR TRICH, YEAST, CLUE
Trichomonas Exam: NEGATIVE
Yeast Exam: NEGATIVE

## 2019-05-23 MED ORDER — METRONIDAZOLE 500 MG PO TABS: 500.0000 mg | ORAL_TABLET | Freq: Two times a day (BID) | ORAL | 0 refills | Status: AC

## 2019-05-23 NOTE — Progress Notes (Signed)
In due to no menses since 02/2019 and unable to feel IUD strings; had Paraguard inserted 11/16/17; declines HIV/RPR testing Debera Lat, RN

## 2019-05-23 NOTE — Progress Notes (Signed)
Wet [place] results reviewed. Patient treated for BV per standing orders. Davyn Elsasser, RN  

## 2019-05-23 NOTE — Progress Notes (Signed)
   [place] problem visit  [place] Department  Subjective:  Alyssa Marquez is a 39 y.o.  G3P3 nonsmoker being seen today for IUD string placement check  Chief Complaint  Patient presents with  . Contraception    HPI LMP 04/16/19 lighter than normal. LNMP 02/2019.  Last sex 05/18/19 without condom.  Paraguard inserted 11/16/17.  Also c/o "bumps" on labia x 8 mo increasing in size.  C/o increased white leukorrhea.  Last pap 12/20/2014 wnl HPV neg Does the patient have a current or past history of drug use? No   No components found for: HCV]   Health Maintenance Due  Topic Date Due  . TETANUS/TDAP  04/13/2001  . INFLUENZA VACCINE  10/28/2018    ROS  The following portions of the patient's history were reviewed and updated as appropriate: allergies, current medications, past family history, past medical history, past social history, past surgical history and problem list. Problem list updated.   See flowsheet for other program required questions.  Objective:   Vitals:   05/23/19 1010  BP: 107/70  Weight: 136 lb (61.7 kg)  Height: 5\' 1"  (1.549 m)    Physical Exam Constitutional:      Appearance: Normal appearance.  Genitourinary:    Exam position: Lithotomy position.     Labia:        Right: Lesion (2-3 hard nodules onset 8 mo ago skin colored without tenderness) present.        Left: Lesion (labial varicosity) present.      Vagina: Vaginal discharge (grey malodorous leukorrhea, ph>4.5) present.     Cervix: Friability (friable to pap) present.     Uterus: Normal.      Adnexa: Right adnexa normal and left adnexa normal.     Rectum: Normal.       Comments: Left labial varicosity Several hard round nodules on bilateral labia probable varicosities vs subaceous nodules--to monitor  Paraguard strings visible Neurological:     Mental Status: She is alert.       Assessment and Plan:  Alyssa Marquez is a 39 y.o. female  presenting to the [facility] Department for a Women's Health problem visit  1. Overweight BMI=25.7   2. Family planning Pap done.   Treat wet mount per standing orders Immunization nurse consult Probable labial varicosities - IGP, Aptima HPV - Chlamydia/Gonorrhea [place] [facility] - WET PREP FOR [place], YEAST, CLUE  3. Encounter for routine checking of intrauterine contraceptive device (IUD) IUD strings visualized     Return if symptoms worsen or fail to improve.  No future appointments.  Herbie Saxon, CNM

## 2019-05-28 LAB — IGP, APTIMA HPV
HPV Aptima: NEGATIVE
PAP Smear Comment: 0

## 2019-06-23 ENCOUNTER — Ambulatory Visit: Payer: Self-pay | Attending: Internal Medicine

## 2020-12-09 ENCOUNTER — Other Ambulatory Visit: Payer: Self-pay

## 2020-12-09 ENCOUNTER — Encounter: Payer: Self-pay | Admitting: Advanced Practice Midwife

## 2020-12-09 ENCOUNTER — Ambulatory Visit (LOCAL_COMMUNITY_HEALTH_CENTER): Payer: Self-pay | Admitting: Advanced Practice Midwife

## 2020-12-09 VITALS — BP 113/77 | Ht 61.0 in | Wt 144.0 lb

## 2020-12-09 DIAGNOSIS — Z3009 Encounter for other general counseling and advice on contraception: Secondary | ICD-10-CM

## 2020-12-09 DIAGNOSIS — Z3049 Encounter for surveillance of other contraceptives: Secondary | ICD-10-CM

## 2020-12-09 LAB — WET PREP FOR TRICH, YEAST, CLUE
Trichomonas Exam: NEGATIVE
Yeast Exam: NEGATIVE

## 2020-12-09 NOTE — Progress Notes (Signed)
Patient here for PE. ParaGuard inserted 11/16/17. Pap 11/16/17 NILM. Wet mount reviewed, no tx per standing orders. PCP list and dental list given to patient. Condoms given.

## 2020-12-09 NOTE — Progress Notes (Signed)
[facility] [place] Number: [phone]    Family Planning Visit- Initial Visit  Subjective:  Alyssa Marquez is a 40 y.o. MHF nonsmoker G3P3003 (3,8,11)  being seen today for an initial annual visit and to discuss contraceptive options.  The patient is currently using IUD Paragard for pregnancy prevention. Patient reports she does not want a pregnancy in the next year.  Patient has the following medical conditions has Uterine fibroid; Illiteracy; and Overweight BMI=25.7 on their problem list.  Chief Complaint  Patient presents with   Annual Exam    Patient reports here for physical. Happy with Paraguard inserted 11/16/17. Last pap 05/23/19 neg HPV neg. LMP 11/10/20. Last sex 12/07/20 with condom; with current partner x 14 years; 1 partner in last 3 mo. Last dental exam 2 years ago. Nursing 65 yo son. Living with husband and 3 kids and not employed.   Patient denies cigs, vaping, cigars, MJ, ETOH  Body mass index is 27.21 kg/m. - Patient is eligible for diabetes screening based on BMI and age [id number]?  not applicable [id number] ordered? not applicable  Patient reports 1  partner/s in last year. Desires STI screening?  Yes  Has patient been screened once for HCV in the past?  No  No results found for: HCVAB  Does the patient have current drug use (including MJ), have a partner with drug use, and/or has been incarcerated since last result? No  If yes-- Screen for HCV through [facility]   Does the patient meet criteria for HBV testing? No  Criteria:  -Household, sexual or needle sharing contact with HBV -History of drug use -HIV positive -Those with known Hep C   Health Maintenance Due  Topic Date Due   Hepatitis C Screening  Never done   TETANUS/TDAP  Never done   INFLUENZA VACCINE  Never done    Review of Systems  Eyes:  Positive for blurred vision (only with migraine).  Cardiovascular:  Positive for  palpitations (onset last couple months and only happens at night when lies on left side feels her heart beat very strong and fast, resolves when changes position and doesn't happen during day--referred to primary care MD).  Neurological:  Positive for headaches (q 2-3 mo always above left eye, +nausea, +blurry vision, -audio relieved with Advil).   The following portions of the patient's history were reviewed and updated as appropriate: allergies, current medications, past family history, past medical history, past social history, past surgical history and problem list. Problem list updated.   See flowsheet for other program required questions.  Objective:   Vitals:   12/09/20 0841  BP: 113/77  Weight: 144 lb (65.3 kg)    Physical Exam Constitutional:      Appearance: Normal appearance. She is normal weight.  HENT:     Head: Normocephalic and atraumatic.     Mouth/Throat:     Mouth: Mucous membranes are moist.     Comments: Last dental exam 2 years ago; urged exam asap Eyes:     Conjunctiva/sclera: Conjunctivae normal.  Neck:     Thyroid: No thyroid mass, thyromegaly or thyroid tenderness.  Cardiovascular:     Rate and Rhythm: Normal rate and regular rhythm.  Pulmonary:     Effort: Pulmonary effort is normal.     Breath sounds: Normal breath sounds.  Chest:  Breasts:    Right: Normal.     Left: Normal.  Abdominal:  Palpations: Abdomen is soft.     Comments: Soft without masses or tenderness, good tone  Genitourinary:    General: Normal vulva.     Exam position: Lithotomy position.     Vagina: Vaginal discharge (white creamy leukorrhea, ph>4.5, denies sxs) present.     Cervix: Normal.     Uterus: Normal.      Adnexa: Right adnexa normal and left adnexa normal.     Rectum: Normal.     Comments: Paraguard string visualized Musculoskeletal:        General: Normal range of motion.     Cervical back: Normal range of motion and neck supple.  Skin:    General: Skin is  warm and dry.  Neurological:     Mental Status: She is alert.  Psychiatric:        Mood and Affect: Mood normal.      Assessment and Plan:  Xee Goard is a 40 y.o. female presenting to the [facility] Department for an initial annual wellness/contraceptive visit  Contraception counseling: Reviewed all forms of birth control options in the tiered based approach. available including abstinence; over the counter/barrier methods; hormonal contraceptive medication including pill, patch, ring, injection,contraceptive implant, ECP; hormonal and nonhormonal IUDs; permanent sterilization options including vasectomy and the various tubal sterilization modalities. Risks, benefits, and typical effectiveness rates were reviewed.  Questions were answered.  Written information was also given to the patient to review.  Patient desires continuation of Paraguard, this was prescribed for patient. She will follow up in  1 year for surveillance.  She was told to call with any further questions, or with any concerns about this method of contraception.  Emphasized use of condoms 100% of the time for STI prevention.  Patient was offered ECP. ECP was not accepted by the patient. ECP counseling was not given - see RN documentation  1. Family planning Please give dental list to pt Please give primary care list to pt Treat wet mount per standing orders Immunization nurse consult - WET PREP FOR [place], YEAST, [place] [facility] - Syphilis Serology, [place] [facility]  2. Encounter for surveillance of other contraceptive      No follow-ups on file.  No future appointments.  Alyssa Marquez, CNM

## 2020-12-12 ENCOUNTER — Telehealth: Payer: Self-pay

## 2020-12-12 NOTE — Telephone Encounter (Signed)
Phone call to pt with [name], Interpreter. Pt counseled about "indeterminate" result for vaginal GC/Chlamydia test (12/09/20), and the need to get another sample for [place] to be able to run the test. Pt desires to have the test completed, scheduled for provider visit on 12/17/20; pt desires for provider to collect sample (states she self collected on 12/09/20).

## 2020-12-17 ENCOUNTER — Other Ambulatory Visit: Payer: Self-pay

## 2020-12-17 ENCOUNTER — Ambulatory Visit: Payer: Self-pay | Admitting: Family Medicine

## 2020-12-17 DIAGNOSIS — Z113 Encounter for screening for infections with a predominantly sexual mode of transmission: Secondary | ICD-10-CM

## 2020-12-17 DIAGNOSIS — B9689 Other specified bacterial agents as the cause of diseases classified elsewhere: Secondary | ICD-10-CM

## 2020-12-17 DIAGNOSIS — N760 Acute vaginitis: Secondary | ICD-10-CM

## 2020-12-17 LAB — WET PREP FOR TRICH, YEAST, CLUE
Trichomonas Exam: NEGATIVE
Yeast Exam: NEGATIVE

## 2020-12-17 MED ORDER — METRONIDAZOLE 500 MG PO TABS: 500.0000 mg | ORAL_TABLET | Freq: Two times a day (BID) | ORAL | 0 refills | Status: AC

## 2020-12-17 NOTE — Progress Notes (Signed)
Pt here for recollection of GC/Chlamydia.  Wet mount results reviewed.  Medication dispensed per Provider orders.   Condoms given. Windle Guard, RN

## 2020-12-17 NOTE — Progress Notes (Signed)
S: Pt in clinic for recollection of GC/ CT due to indeterminate result.  Pt denies any s/sx but wanted to  be re-screened.  Pt reported self collected last sample.    O: Pt wet prep - neg,GC/ Ct.....  A: 1. Screening examination for venereal disease External genitalia without, lice, nits, erythema, edema , lesions or inguinal adenopathy. Vagina with normal mucosa and yellowish discharge and pH > 4, odor present.  Cervix without visual lesions, uterus firm, mobile, non-tender, no masses, CMT adnexal fullness or tenderness.   - Chlamydia/Gonorrhea [place] [facility] - WET PREP FOR [place], YEAST, CLUE  2. Bacterial vaginosis  - metroNIDAZOLE (FLAGYL) 500 MG tablet; Take 1 tablet (500 mg total) by mouth 2 (two) times daily for 7 days.  Dispense: 14 tablet; Refill: 0   P: Wet prep and CT/GC collected today.  Wet prep results neg    Treatment needed for BV d/t provider assessment of discharge and odor.  Discussed time line for [facility] Lab results and that patient will be called with positive results and encouraged patient to call if she had not heard in 2 weeks.  Counseled to return or seek care for continued or worsening symptoms Recommended condom use with all sex  V. Olmedo used for [place] interpretation.     Junious Dresser, FNP

## 2021-03-07 ENCOUNTER — Other Ambulatory Visit: Payer: Self-pay

## 2021-03-07 ENCOUNTER — Emergency Department
Admission: EM | Admit: 2021-03-07 | Discharge: 2021-03-07 | Disposition: A | Discharge status: Home / Self Care | Payer: Self-pay | Attending: Emergency Medicine | Admitting: Emergency Medicine

## 2021-03-07 ENCOUNTER — Emergency Department: Payer: Self-pay

## 2021-03-07 DIAGNOSIS — R079 Chest pain, unspecified: Secondary | ICD-10-CM | POA: Insufficient documentation

## 2021-03-07 DIAGNOSIS — R002 Palpitations: Secondary | ICD-10-CM | POA: Insufficient documentation

## 2021-03-07 DIAGNOSIS — N9489 Other specified conditions associated with female genital organs and menstrual cycle: Secondary | ICD-10-CM | POA: Insufficient documentation

## 2021-03-07 DIAGNOSIS — G245 Blepharospasm: Secondary | ICD-10-CM | POA: Insufficient documentation

## 2021-03-07 LAB — CBC
HCT: 39 % (ref 36.0–46.0)
Hemoglobin: 12.4 g/dL (ref 12.0–15.0)
MCH: 24.8 pg — ABNORMAL LOW (ref 26.0–34.0)
MCHC: 31.8 g/dL (ref 30.0–36.0)
MCV: 77.8 fL — ABNORMAL LOW (ref 80.0–100.0)
Platelets: 246 10*3/uL (ref 150–400)
RBC: 5.01 MIL/uL (ref 3.87–5.11)
RDW: 15.9 % — ABNORMAL HIGH (ref 11.5–15.5)
WBC: 5.7 10*3/uL (ref 4.0–10.5)
nRBC: 0 % (ref 0.0–0.2)

## 2021-03-07 LAB — BASIC METABOLIC PANEL
Anion gap: 7 (ref 5–15)
BUN: 15 mg/dL (ref 6–20)
CO2: 23 mmol/L (ref 22–32)
Calcium: 8.7 mg/dL — ABNORMAL LOW (ref 8.9–10.3)
Chloride: 105 mmol/L (ref 98–111)
Creatinine, Ser: 0.54 mg/dL (ref 0.44–1.00)
GFR, Estimated: >60 mL/min (ref >60)
Glucose, Bld: 95 mg/dL (ref 70–99)
Potassium: 3.9 mmol/L (ref 3.5–5.1)
Sodium: 135 mmol/L (ref 135–145)

## 2021-03-07 LAB — TSH: TSH: 0.714 u[IU]/mL (ref 0.350–4.500)

## 2021-03-07 LAB — TROPONIN I (HIGH SENSITIVITY)
Troponin I (High Sensitivity): 3 ng/L (ref <18)
Troponin I (High Sensitivity): <2 ng/L (ref <18)

## 2021-03-07 LAB — T4, FREE: Free T4: 0.85 ng/dL (ref 0.61–1.12)

## 2021-03-07 LAB — HCG, QUANTITATIVE, PREGNANCY: hCG, Beta Chain, Quant, S: <1 m[IU]/mL (ref <5)

## 2021-03-07 NOTE — ED Triage Notes (Signed)
Pt states that she has had chest pain for 3-4 weeks with palpitations- pt states it is causing her to have trouble sleeping- pt states the pain is more on the L side- pt states she started having migraines 8 moths ago and when she gets them her vision gets blurry

## 2021-03-07 NOTE — ED Provider Notes (Signed)
[facility] Emergency Department Provider Note   ____________________________________________   Event Date/Time   First MD Initiated Contact with Patient 03/07/21 1547     (approximate)  I have reviewed the triage vital signs and the nursing notes.   HISTORY  Chief Complaint Chest Pain and Palpitations   HPI Alyssa Marquez is a 40 y.o. female with past medical history of uterine fibroids who presents to the ED complaining of chest pain.  History is limited as patient is Spanish-speaking only, history obtained via in person interpreter.  Patient reports that she has been dealing with intermittent pain in her chest for about the past month.  She describes it as a sharp pain that is worse when she goes to lay down at night.  She also feels like her heart is racing at these times and that it will skip a beat at times.  She denies any associated fevers, cough, or shortness of breath.  She does state that she will occasionally have a "fluttering" of the muscles around either eye on occasion, but she denies any pain in either eye or changes in her vision.  She states she has been seen by her PCP for this problem and told that if it gets worse she needs to be seen by a cardiologist.         Past Medical History:  Diagnosis Date   Medical history non-contributory     Patient Active Problem List   Diagnosis Date Noted   Overweight BMI=27.2 05/23/2019   Uterine fibroid 04/28/2017   Illiteracy 03/01/2017    Past Surgical History:  Procedure Laterality Date   NO PAST SURGERIES      Prior to Admission medications   Medication Sig Start Date End Date Taking? Authorizing Provider  acetaminophen (TYLENOL) 325 MG tablet Take 650 mg by mouth every 6 (six) hours as needed. Patient not taking: Reported on 12/17/2020    [provider]  ibuprofen (ADVIL,MOTRIN) 600 MG tablet Take 1 tablet (600 mg total) by mouth every 6 (six) hours. Patient not taking:  Reported on 12/17/2020 09/25/17   McVey, Murray Hodgkins, CNM  PARAGARD INTRAUTERINE COPPER IU by Intrauterine route. 11/16/17   Willaim Sheng, NP  Prenatal Vit-Fe Fumarate-FA (PRENATAL MULTIVITAMIN) TABS tablet Take 1 tablet by mouth daily at 12 noon. Patient not taking: No sig reported    [provider]  senna-docusate (SENOKOT-S) 8.6-50 MG tablet Take 2 tablets by mouth daily. Patient not taking: No sig reported 09/26/17   McVey, Murray Hodgkins, CNM    Allergies Patient has no known allergies.  Family History  Problem Relation Age of Onset   Heart disease Mother    Heart attack Mother    Hypertension Mother    High Cholesterol Mother     Social History Social History   Tobacco Use   Smoking status: Never   Smokeless tobacco: Never  Substance Use Topics   Alcohol use: No   Drug use: No    Review of Systems  Constitutional: No fever/chills Eyes: No visual changes. ENT: No sore throat. Cardiovascular: Positive for chest pain and palpitations. Respiratory: Denies shortness of breath. Gastrointestinal: No abdominal pain.  No nausea, no vomiting.  No diarrhea.  No constipation. Genitourinary: Negative for dysuria. Musculoskeletal: Negative for back pain. Skin: Negative for rash. Neurological: Negative for headaches, focal weakness or numbness.  ____________________________________________   PHYSICAL EXAM:  VITAL SIGNS: ED Triage Vitals  Enc Vitals Group     BP 03/07/21  1149 117/78     Pulse Rate 03/07/21 1149 87     Resp 03/07/21 1149 18     Temp 03/07/21 1149 98.5 F (36.9 C)     Temp Source 03/07/21 1149 Oral     SpO2 03/07/21 1149 98 %     Weight 03/07/21 1150 149 lb (67.6 kg)     Height 03/07/21 1150 5' 2.6" (1.59 m)     Head Circumference --      Peak Flow --      Pain Score 03/07/21 1149 7     Pain Loc --      Pain Edu? --      Excl. in [place]? --     Constitutional: Alert and oriented. Eyes: Conjunctivae are normal.  Pupils equal, round, and  reactive to light bilaterally.  Extraocular movements intact. Head: Atraumatic. Nose: No congestion/rhinnorhea. Mouth/Throat: Mucous membranes are moist. Neck: Normal ROM Cardiovascular: Normal rate, regular rhythm. Grossly normal heart sounds.  2+ radial pulses bilaterally. Respiratory: Normal respiratory effort.  No retractions. Lungs CTAB. Gastrointestinal: Soft and nontender. No distention. Genitourinary: deferred Musculoskeletal: No lower extremity tenderness nor edema. Neurologic:  Normal speech and language. No gross focal neurologic deficits are appreciated. Skin:  Skin is warm, dry and intact. No rash noted. Psychiatric: Mood and affect are normal. Speech and behavior are normal.  ____________________________________________   LABS (all labs ordered are listed, but only abnormal results are displayed)  Labs Reviewed  BASIC METABOLIC PANEL - Abnormal; Notable for the following components:      Result Value   Calcium 8.7 (*)    All other components within normal limits  CBC - Abnormal; Notable for the following components:   MCV 77.8 (*)    MCH 24.8 (*)    RDW 15.9 (*)    All other components within normal limits  TSH  T4, FREE  HCG, QUANTITATIVE, PREGNANCY  TROPONIN I (HIGH SENSITIVITY)  TROPONIN I (HIGH SENSITIVITY)   ____________________________________________  EKG  ED ECG REPORT I, Blake Divine, the attending physician, personally viewed and interpreted this ECG.   Date: 03/07/2021  EKG Time: 11:52  Rate: 81  Rhythm: normal sinus rhythm  Axis: Normal  Intervals:none  ST&T Change: None   PROCEDURES  Procedure(s) performed (including Critical Care):  Procedures   ____________________________________________   INITIAL IMPRESSION / ASSESSMENT AND PLAN / ED COURSE      40 year old female with past medical history of fibroids presents to the ED complaining of intermittent chest pain and palpitations occurring over the past month and worse when  she goes to lay down around bedtime.  She denies any chest pain currently and EKG shows no evidence of arrhythmia or ischemia, 2 sets of troponin are negative.  Given reassuring vital signs with no current symptoms, doubt PE or dissection.  Chest x-ray reviewed by me and shows no infiltrate, edema, or effusion.  We will add on TSH and T4 levels, observe on cardiac monitor for any potential arrhythmia.  Symptoms related to her eyes sound most consistent with blepharospasm, she denies any visual changes and no symptoms to suggest eye infection.  No events noted on cardiac monitor, thyroid studies are unremarkable.  Patient reports feeling better with no ongoing chest pain or palpitations at this time.  She is appropriate for discharge home and was provided with referral to cardiology for possible Holter monitor.  She was counseled to return to the ED for new worsening symptoms, patient agrees with plan.  ____________________________________________   FINAL CLINICAL IMPRESSION(S) / ED DIAGNOSES  Final diagnoses:  Palpitations  Chest pain, unspecified type  Blepharospasm     ED Discharge Orders     None        Note:  This document was prepared using Dragon voice recognition software and may include unintentional dictation errors.    Blake Divine, MD 03/07/21 [phone]

## 2021-03-07 NOTE — ED Notes (Signed)
Interpreter consult placed

## 2021-03-07 NOTE — ED Notes (Signed)
Pt states she has had pain in her chest x3 weeks and also a HA. NAD at this time.

## 2021-03-07 NOTE — ED Notes (Signed)
Interpreter requested for discharge

## 2021-06-10 ENCOUNTER — Encounter: Payer: Self-pay | Admitting: Nurse Practitioner

## 2021-06-10 ENCOUNTER — Ambulatory Visit (LOCAL_COMMUNITY_HEALTH_CENTER): Payer: Self-pay | Admitting: Nurse Practitioner

## 2021-06-10 ENCOUNTER — Other Ambulatory Visit: Payer: Self-pay

## 2021-06-10 VITALS — BP 108/74 | Ht 61.9 in | Wt 142.8 lb

## 2021-06-10 DIAGNOSIS — Z30011 Encounter for initial prescription of contraceptive pills: Secondary | ICD-10-CM

## 2021-06-10 DIAGNOSIS — Z113 Encounter for screening for infections with a predominantly sexual mode of transmission: Secondary | ICD-10-CM

## 2021-06-10 DIAGNOSIS — Z3009 Encounter for other general counseling and advice on contraception: Secondary | ICD-10-CM

## 2021-06-10 LAB — WET PREP FOR TRICH, YEAST, CLUE
Trichomonas Exam: NEGATIVE
Yeast Exam: NEGATIVE

## 2021-06-10 MED ORDER — NORETHINDRONE 0.35 MG PO TABS: 1.0000 | ORAL_TABLET | Freq: Every day | ORAL | 6 refills | Status: AC

## 2021-06-10 MED ORDER — NORGESTIMATE-ETH ESTRADIOL 0.25-35 MG-MCG PO TABS
1.0000 | ORAL_TABLET | Freq: Every day | ORAL | 5 refills | Status: DC
Start: 2021-06-10 — End: 2021-06-10

## 2021-06-10 NOTE — Progress Notes (Signed)
? ?[place] problem visit  ?[place] Department ? ?Subjective:  ?Alyssa Marquez is a 41 y.o. being seen today for an IUD removal and desires a new birth control method.  Patient states for an entire year she has had two heavy menstrual periods a month and increased cramping.  She desires to switch to birth control pills today and also would like STD screening.  ? ?Chief Complaint  ?Patient presents with  ? Contractions  ?  IUD removal and BC options  ? ? ?HPI ? ? ?Does the patient have a current or past history of drug use? No  ? No components found for: HCV] ? ? ?Health Maintenance Due  ?Topic Date Due  ? Hepatitis C Screening  Never done  ? TETANUS/TDAP  Never done  ? INFLUENZA VACCINE  Never done  ? ? ?Review of Systems  ?Constitutional:  Negative for chills, fever, malaise/fatigue and weight loss.  ?HENT:  Negative for congestion, hearing loss and sore throat.   ?Eyes:  Negative for blurred vision, double vision and photophobia.  ?Respiratory:  Negative for shortness of breath.   ?Cardiovascular:  Negative for chest pain.  ?Gastrointestinal:  Negative for abdominal pain, blood in stool, constipation, diarrhea, heartburn, nausea and vomiting.  ?Genitourinary:  Negative for dysuria and frequency.  ?Musculoskeletal:  Negative for back pain, joint pain and neck pain.  ?Skin:  Negative for itching and rash.  ?Neurological:  Negative for dizziness, weakness and headaches.  ?Endo/Heme/Allergies:  Does not bruise/bleed easily.  ?Psychiatric/Behavioral:  Negative for depression, substance abuse and suicidal ideas.   ? ?The following portions of the patient's history were reviewed and updated as appropriate: allergies, current medications, past family history, past medical history, past social history, past surgical history and problem list. Problem list updated. ? ? ?See flowsheet for other program required questions. ? ?Objective:  ? ?Vitals:  ? 06/10/21 1039  ?BP: 108/74  ?Weight:  142 lb 12.8 oz (64.8 kg)  ?Height: 5' 1.9" (1.572 m)  ? ? ?Physical Exam ?Constitutional:   ?   Appearance: Normal appearance.  ?HENT:  ?   Head: Normocephalic.  ?   Right Ear: External ear normal.  ?   Left Ear: External ear normal.  ?   Nose: Nose normal.  ?   Mouth/Throat:  ?   Mouth: Mucous membranes are moist.  ?Cardiovascular:  ?   Rate and Rhythm: Normal rate.  ?Pulmonary:  ?   Effort: Pulmonary effort is normal.  ?Abdominal:  ?   General: Abdomen is flat.  ?   Palpations: Abdomen is soft.  ?Genitourinary: ?   Comments: External genitalia/pubic area without nits, lice, edema, erythema, lesions and inguinal adenopathy. ?Vagina with normal mucosa and discharge. ?Cervix without visible lesions. ?Uterus firm, mobile, nt, no masses, no CMT, no adnexal tenderness or fullness. pH 4.5. ?Musculoskeletal:  ?   Cervical back: Full passive range of motion without pain, normal range of motion and neck supple.  ?Skin: ?   General: Skin is warm and dry.  ?Neurological:  ?   Mental Status: She is alert and oriented to person, place, and time.  ?Psychiatric:     ?   Attention and Perception: Attention normal.     ?   Mood and Affect: Mood normal.     ?   Speech: Speech normal.     ?   Behavior: Behavior is cooperative.  ? ? ? ? ?Assessment and Plan:  ?Analiza Cowger is  a 41 y.o. female presenting to the [facility] Department for a Women's Health problem visit ? ?1. Family planning counseling ?-RP due 11/2021 ?-Patient desires to have IUD removed today. Please see flowsheet and IUD removal note.  Patient desires birth control pills today.  ? ?2. General counseling for prescription of oral contraceptives ?-Patient currently breastfeeding her son.  Will give Micornor 0.35 MG PO daily, may have 6 packs today until next physical 11/2021. ?- norethindrone (MICRONOR) 0.35 MG tablet; Take 1 tablet (0.35 mg total) by mouth daily.  Dispense: 28 tablet; Refill: 6 ? ?2. Screening examination for venereal disease ?-Patient  agrees to STD screening today. ?-Patient accepted all screenings including vaginal CT/GC and declines bloodwork for HIV/RPR.  ?Patient meets criteria for HepB screening? No. Ordered? No - low risk ?Patient meets criteria for HepC screening? No. Ordered? No - low risk  ? ?Treat wet prep per standing order ? ?Discussed time line for [facility] results and that patient will be called with positive results and encouraged patient to call if she had not heard in 2 weeks.  ?Counseled to return or seek care for continued or worsening symptoms ?Recommended condom use with all sex ? ?Patient is currently using  the ParaGard  to prevent pregnancy.  Patient desires to switch to birth control pills.  ?- Chlamydia/Gonorrhea [place] [facility] ?- WET PREP FOR [place], YEAST, CLUE ? ?Due to a language barrier an interpreter [place] was used for the provider portion of the visit.    ? ? ?Return in about 6 months (around 12/11/2021) for Annual well-woman exam. ? ?No future appointments. ? ?Gregary Cromer, FNP ? ?

## 2021-06-10 NOTE — Progress Notes (Signed)
? ?  IUD Removal  ?Patient identified, informed consent performed, consent signed.  Patient was in the dorsal lithotomy position, normal external genitalia was noted.  A speculum was placed in the patient's vagina, normal discharge was noted, no lesions. The cervix was visualized, no lesions, no abnormal discharge.  The strings of the IUD were grasped and pulled using ring forceps. The IUD was removed in its entirety.  Patient tolerated the procedure well.   ? ?Patient will use pills for contraception.  Routine preventative health maintenance measures emphasized. Gregary Cromer, FNP  ?

## 2021-06-10 NOTE — Progress Notes (Signed)
Pt here for IUD removal and birth control options.  IUD removed, by Provider, without any complications.  Micronor # 6 given to pt.  Condoms declined.  Pt notified to return in 6 months for PE.  Windle Guard, RN ? ?

## 2021-06-19 ENCOUNTER — Telehealth: Payer: Self-pay | Admitting: Family Medicine

## 2023-06-10 ENCOUNTER — Ambulatory Visit: Payer: Self-pay

## 2023-06-10 VITALS — BP 121/76 | Ht 61.89 in | Wt 143.5 lb

## 2023-06-10 DIAGNOSIS — Z3201 Encounter for pregnancy test, result positive: Secondary | ICD-10-CM

## 2023-06-10 DIAGNOSIS — Z3009 Encounter for other general counseling and advice on contraception: Secondary | ICD-10-CM

## 2023-06-10 LAB — PREGNANCY, URINE: Preg Test, Ur: POSITIVE — AB

## 2023-06-10 MED ORDER — PRENATAL 27-0.8 MG PO TABS: 1.0000 | ORAL_TABLET | Freq: Every day | ORAL | Status: AC

## 2023-06-10 NOTE — Progress Notes (Signed)
 UPT positive. Plans prenatal care at [facility]. Positive preg packet given.   The patient was dispensed prenatal vitamins #100  today per [facility] Dr Lorrin Mais. I provided counseling today regarding the medication. We discussed the medication, the side effects and when to call clinic. Patient given the opportunity to ask questions. Questions answered.    Alex Gardener, interpreter for visit today. Jerel Shepherd, RN
